# Patient Record
Sex: Male | Born: 1975
Health system: Southern US, Community
[De-identification: ages and names within clinical notes are randomized; demographics above are authoritative.]

## PROBLEM LIST (undated history)

## (undated) DIAGNOSIS — L409 Psoriasis, unspecified: Secondary | ICD-10-CM

## (undated) DIAGNOSIS — R002 Palpitations: Secondary | ICD-10-CM

## (undated) HISTORY — DX: Palpitations: R00.2

---

## 2010-02-01 ENCOUNTER — Emergency Department (HOSPITAL_COMMUNITY): Admission: EM | Admit: 2010-02-01 | Discharge: 2010-02-02 | Payer: Self-pay | Admitting: Emergency Medicine

## 2010-08-23 LAB — URINALYSIS, ROUTINE W REFLEX MICROSCOPIC
Bilirubin Urine: NEGATIVE
Glucose, UA: NEGATIVE mg/dL
Hgb urine dipstick: NEGATIVE
Ketones, ur: >80 mg/dL — AB
Leukocytes, UA: NEGATIVE
Protein, ur: 30 mg/dL — AB
Specific Gravity, Urine: 1.03 (ref 1.005–1.030)

## 2010-08-23 LAB — DIFFERENTIAL
Basophils Absolute: 0 10*3/uL (ref 0.0–0.1)
Basophils Relative: 0 % (ref 0–1)
Eosinophils Absolute: 0 10*3/uL (ref 0.0–0.7)
Eosinophils Relative: 0 % (ref 0–5)
Lymphs Abs: 1.4 10*3/uL (ref 0.7–4.0)
Neutro Abs: 8.5 10*3/uL — ABNORMAL HIGH (ref 1.7–7.7)
Neutrophils Relative %: 83 % — ABNORMAL HIGH (ref 43–77)

## 2010-08-23 LAB — COMPREHENSIVE METABOLIC PANEL
AST: 16 U/L (ref 0–37)
Albumin: 4.6 g/dL (ref 3.5–5.2)
Calcium: 9.4 mg/dL (ref 8.4–10.5)
GFR calc Af Amer: >60 mL/min (ref >60)
GFR calc non Af Amer: >60 mL/min (ref >60)
Glucose, Bld: 110 mg/dL — ABNORMAL HIGH (ref 70–99)
Potassium: 3.6 mEq/L (ref 3.5–5.1)

## 2010-08-23 LAB — URINE MICROSCOPIC-ADD ON

## 2010-08-23 LAB — CBC
HCT: 47.7 % (ref 39.0–52.0)
MCH: 31.1 pg (ref 26.0–34.0)
MCHC: 35.4 g/dL (ref 30.0–36.0)
MCV: 87.8 fL (ref 78.0–100.0)
RDW: 13.7 % (ref 11.5–15.5)

## 2010-08-23 LAB — URINE CULTURE
Colony Count: NO GROWTH
Culture: NO GROWTH

## 2017-08-07 ENCOUNTER — Encounter (HOSPITAL_COMMUNITY): Payer: Self-pay | Admitting: Emergency Medicine

## 2017-08-07 ENCOUNTER — Other Ambulatory Visit: Payer: Self-pay

## 2017-08-07 ENCOUNTER — Ambulatory Visit (HOSPITAL_COMMUNITY)
Admission: EM | Admit: 2017-08-07 | Discharge: 2017-08-07 | Disposition: A | Discharge status: Home / Self Care | Payer: 59 | Attending: Internal Medicine | Admitting: Internal Medicine

## 2017-08-07 DIAGNOSIS — M79605 Pain in left leg: Secondary | ICD-10-CM

## 2017-08-07 MED ORDER — MELOXICAM 7.5 MG PO TABS: 7.5000 mg | ORAL_TABLET | Freq: Every day | ORAL | 0 refills | Status: DC

## 2017-08-07 NOTE — Discharge Instructions (Signed)
Start Mobic as directed for pain/inflammation.  Ice compress, elevation, knee sleeve during activity.  Monitor for any worsening of symptoms, one-sided leg swelling, redness, increased warmth, chest pain, shortness of breath, go to the emergency department for further evaluation.  Otherwise follow-up with PCP for further evaluation and management needed.

## 2017-08-07 NOTE — ED Triage Notes (Signed)
Pt states he was working in the hull of his boat over a week ago, with his legs folded under him for almost an hour.  When he got up he had pain in his left ankle and knee along with a feeling of the leg being asleep that lasted for over an hour.  Pt states he woke up the next day feeling a little better but still had numbness in the toes.  He now states he has pain in his left upper posterior calf and he states he has a lump that that he feels is a bulging vein.

## 2017-08-07 NOTE — ED Provider Notes (Signed)
[facility]    CSN: [id number] Arrival date & time: 08/07/17  1010     History   Chief Complaint Chief Complaint  Patient presents with  . Leg Pain    left    HPI Daniel Ferguson is a 42 y.o. male.   42 year old male comes in for left leg pain. States that he was working on his boat 1 week ago, where he had his left leg folded under him for over an hour. When he got up, had numbness/tingling of the left lower extremity.  States that episode lasted for about an hour.  Patient also stated while he was sitting, did feel some pain of the lateral side of the left knee, and wonders if he strained something.  States he woke up the next morning, with improving symptoms, but had continued numbness and tingling of the toes.  He states that he feels a slight bump on the posterior side of his knee/calf.  Denies tenderness to palpation, erythema, increased warmth.  Denies swelling of the legs.  Denies chest pain, shortness of breath.  Has been taking aspirin without relief.      History reviewed. No pertinent past medical history.  There are no active problems to display for this patient.   History reviewed. No pertinent surgical history.     Home Medications    Prior to Admission medications   Medication Sig Start Date End Date Taking? Authorizing Provider  meloxicam (MOBIC) 7.5 MG tablet Take 1 tablet (7.5 mg total) by mouth daily. 08/07/17   Daniel Ferguson, Daniel Ofarrell V, PA-C    Family History No family history on file.  Social History Social History   Tobacco Use  . Smoking status: Current Every Day Smoker    Packs/day: 1.00    Types: Cigarettes  . Smokeless tobacco: Never Used  Substance Use Topics  . Alcohol use: Yes    Comment: rarely  . Drug use: Yes    Types: Marijuana     Allergies   Patient has no known allergies.   Review of Systems Review of Systems  Reason unable to perform ROS: See HPI as above.     Physical Exam Triage Vital Signs ED Triage Vitals    Enc Vitals Group     BP 08/07/17 1103 120/72     Pulse Rate 08/07/17 1103 79     Resp --      Temp 08/07/17 1103 98.4 F (36.9 C)     Temp Source 08/07/17 1103 Oral     SpO2 08/07/17 1103 100 %     Weight --      Height --      Head Circumference --      Peak Flow --      Pain Score 08/07/17 1100 0     Pain Loc --      Pain Edu? --      Excl. in GC? --    No data found.  Updated Vital Signs BP 120/72 (BP Location: Left Arm)   Pulse 79   Temp 98.4 F (36.9 C) (Oral)   SpO2 100%   Physical Exam  Constitutional: He is oriented to person, place, and time. He appears well-developed and well-nourished. No distress.  HENT:  Head: Normocephalic and atraumatic.  Eyes: Conjunctivae are normal. Pupils are equal, round, and reactive to light.  Musculoskeletal:  No swelling, erythema, increased warmth. 0.5 cm bulging of vein on the posterior knee consistent with varicose vein.  No  tenderness to palpation, erythema, increased warmth.  No tenderness to palpation of the knee.  Full range of motion of knee, ankle, toes.  Sensation intact and equal bilaterally.  Strength normal and equal bilaterally.  Pedal pulses 2+ and equal bilaterally.  Cap refill less than 2 seconds.  No pitting edema.  Neurological: He is alert and oriented to person, place, and time.     UC Treatments / Results  Labs (all labs ordered are listed, but only abnormal results are displayed) Labs Reviewed - No data to display  EKG  EKG Interpretation None       Radiology No results found.  Procedures Procedures (including critical care time)  Medications Ordered in UC Medications - No data to display   Initial Impression / Assessment and Plan / UC Course  I have reviewed the triage vital signs and the nursing notes.  Pertinent labs & imaging results that were available during my care of the patient were reviewed by me and considered in my medical decision making (see chart for details).    Left  lower extremity without swelling, erythema, increased warmth, low suspicion for DVT.  Slight bulging of the vein consistent with varicose vein.  Start Mobic for pain and inflammation.  Ice compress, elevation, knee sleeve during activity.  Patient to follow-up with PCP for further evaluation treatment needed.  Return precautions given.  Patient expresses understanding and agrees to plan.  Final Clinical Impressions(s) / UC Diagnoses   Final diagnoses:  Left leg pain    ED Discharge Orders        Ordered    meloxicam (MOBIC) 7.5 MG tablet  Daily     08/07/17 1215        Daniel Fisher, PA-C 08/07/17 1222

## 2017-09-07 ENCOUNTER — Other Ambulatory Visit: Payer: Self-pay

## 2017-09-07 ENCOUNTER — Encounter: Payer: Self-pay | Admitting: Family Medicine

## 2017-09-07 ENCOUNTER — Ambulatory Visit (INDEPENDENT_AMBULATORY_CARE_PROVIDER_SITE_OTHER): Payer: 59 | Admitting: Family Medicine

## 2017-09-07 VITALS — BP 110/70 | HR 76 | Temp 98.7°F | Ht 66.5 in | Wt 130.0 lb

## 2017-09-07 DIAGNOSIS — Z23 Encounter for immunization: Secondary | ICD-10-CM

## 2017-09-07 DIAGNOSIS — Z7689 Persons encountering health services in other specified circumstances: Secondary | ICD-10-CM | POA: Insufficient documentation

## 2017-09-07 DIAGNOSIS — F1290 Cannabis use, unspecified, uncomplicated: Secondary | ICD-10-CM | POA: Diagnosis not present

## 2017-09-07 DIAGNOSIS — H9313 Tinnitus, bilateral: Secondary | ICD-10-CM | POA: Diagnosis not present

## 2017-09-07 DIAGNOSIS — K1370 Unspecified lesions of oral mucosa: Secondary | ICD-10-CM | POA: Insufficient documentation

## 2017-09-07 DIAGNOSIS — F17200 Nicotine dependence, unspecified, uncomplicated: Secondary | ICD-10-CM

## 2017-09-07 HISTORY — DX: Unspecified lesions of oral mucosa: K13.70

## 2017-09-07 NOTE — Assessment & Plan Note (Signed)
Red discoloration on R lower lip in patient with significant sun exposure and smoking hx.  Patient does not know specific day it appeared but it has been present for ~2-62109yrs.  No changes in that time, not palpable, no purulence/leaking/pain  Referal to ENT for this and the tinnitis

## 2017-09-07 NOTE — Progress Notes (Signed)
Subjective:  Daniel Ferguson is a 42 y.o. male who [facility] with a chief complaint of establishing care.   Patient would like to establish care.  He is not taking any current medications and has no significant surgical/medical hx.   He does say he still smokes and is trying to quit and has reduced his intake.   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  He declines a nicotine patch today.   He does smoke marijuana occasionally and it does not interfere with his normal responsibilities.He mentions ringing in both ears, this has been going on for years now and he attributes it to loud construction jobs.  He says he can still hear otherwise but it's annoying and he would like to see a ENT to discuss this.   He also mentions a lesion on his right lower lip, it's a ~871mm red discoloration that is not palpable/painful.  It is not leaking/draining and has not changed size in the last 2-3 years sicne he noticed it.  A dentist told him to get it "checked out"  Review of Systems  Constitutional: Negative for chills and fever.  HENT: Positive for tinnitus. Negative for ear pain, hearing loss, sinus pain and sore throat.   Eyes: Negative for blurred vision, double vision and pain.  Respiratory: Negative for cough and shortness of breath.   Cardiovascular: Negative for chest pain, palpitations, orthopnea and leg swelling.  Gastrointestinal: Negative for abdominal pain and blood in stool.  Genitourinary: Negative for dysuria.  Musculoskeletal: Positive for back pain. Negative for joint pain.  Skin: Negative for rash.  Neurological: Negative for loss of consciousness.  Psychiatric/Behavioral: Positive for substance abuse.     Objective:  Physical Exam: BP 110/70   Pulse 76   Temp 98.7 F (37.1 C) (Oral)   Ht 5' 6.5" (1.689 m)   Wt 130 lb (59 kg)   SpO2 97%   BMI 20.67 kg/m   Gen: NAD, resting comfortably, appears fit CV: RRR with  no murmurs appreciated Pulm: NWOB, CTAB with no crackles, wheezes, or rhonchi GI: Normal bowel sounds present. Soft, Nontender, Nondistended. MSK: no edema, cyanosis, or clubbing noted Skin: warm, dry Neuro: grossly normal, moves all extremities Psych: Normal affect and thought content  No results found for this or any previous visit (from the past 72 hour(s)).   Assessment/Plan:  Oral lesion Red discoloration on R lower lip in patient with significant sun exposure and smoking hx.  Patient does not know specific day it appeared but it has been present for ~2-3331yrs.  No changes in that time, not palpable, no purulence/leaking/pain  Referal to ENT for this and the tinnitis  Tinnitus of both ears Tinnitis in both ears, chronic for years.  Still claims good hearing otherwise.   Has been in [employer] and likely has chronic damage from inadequate hearing protection.  ENT referral for this an concern of oral lesion, advised to be diligent about hearing protection  Tobacco use disorder ~20 pack year hx.  No medical workup for this, has been trying to cut down from 1 pack to half pack.  Encouraged cessation, offered nicotine patch but patient declined.  We did order a screening CXR since he has not had regular medical care but he will call his insurance to verify cost first.  Marijuana use Still uses occasionally.  Not affecting daily activities  Encouraged cessation   Marthenia RollingScott Scharlene Catalina, DO FAMILY MEDICINE  RESIDENT - PGY1 09/07/2017 5:34 PM

## 2017-09-07 NOTE — Assessment & Plan Note (Addendum)
~  20 pack year hx.  No medical workup for this, has been trying to cut down from 1 pack to half pack.  Encouraged cessation, offered nicotine patch but patient declined.  We did order a screening CXR since he has not had regular medical care but he will call his insurance to verify cost first.

## 2017-09-07 NOTE — Patient Instructions (Signed)
It was a pleasure to see you today! Thank you for choosing [facility] for your primary care. Daniel Ferguson was seen for establishing care. Come back to the clinic if you have any new concernns, and go to the emergency room if you have life threatening symptoms.  Thanks for coming.  We have [facility] and sent in a referal to an ear/nose/throat doctor. We also offered some nicotine patches to help stop smoking which you said you don't need quite yet.  It was nice meeting you.   If we did any lab work today, and the results require attention, either me or my nurse will get in touch with you. If everything is normal, you will get a letter in mail and a message via . If you don't hear from us in two weeks, please give us a call. Otherwise, we look forward to seeing you again at your next visit. If you have any questions or concerns before then, please call the clinic at [phone].  Please bring all your medications to every doctors visit  Sign up for My Chart to have easy access to your labs results, and communication with your Primary care physician.    Please check-out at the front desk before leaving the clinic.    Best,  Dr. Marthenia RollingScott Finnis Ferguson FAMILY MEDICINE RESIDENT - PGY1 09/07/2017 9:07 AM

## 2017-09-07 NOTE — Assessment & Plan Note (Signed)
Still uses occasionally.  Not affecting daily activities  Encouraged cessation

## 2017-09-07 NOTE — Assessment & Plan Note (Signed)
Tinnitis in both ears, chronic for years.  Still claims good hearing otherwise.   Has been in [employer] and likely has chronic damage from inadequate hearing protection.  ENT referral for this an concern of oral lesion, advised to be diligent about hearing protection

## 2017-09-08 ENCOUNTER — Ambulatory Visit
Admission: RE | Admit: 2017-09-08 | Discharge: 2017-09-08 | Disposition: A | Discharge status: Home / Self Care | Payer: 59 | Source: Ambulatory Visit | Attending: Family Medicine | Admitting: Family Medicine

## 2017-09-08 DIAGNOSIS — F17200 Nicotine dependence, unspecified, uncomplicated: Secondary | ICD-10-CM

## 2018-09-03 IMAGING — CR DG CHEST 2V
2 series · 2 of 2 positions shown · non-contrast
Comparison: None in PACs

CLINICAL DATA: Left chest discomfort.  Current smoker.

EXAM:
CHEST - 2 VIEW

[w chest pa]
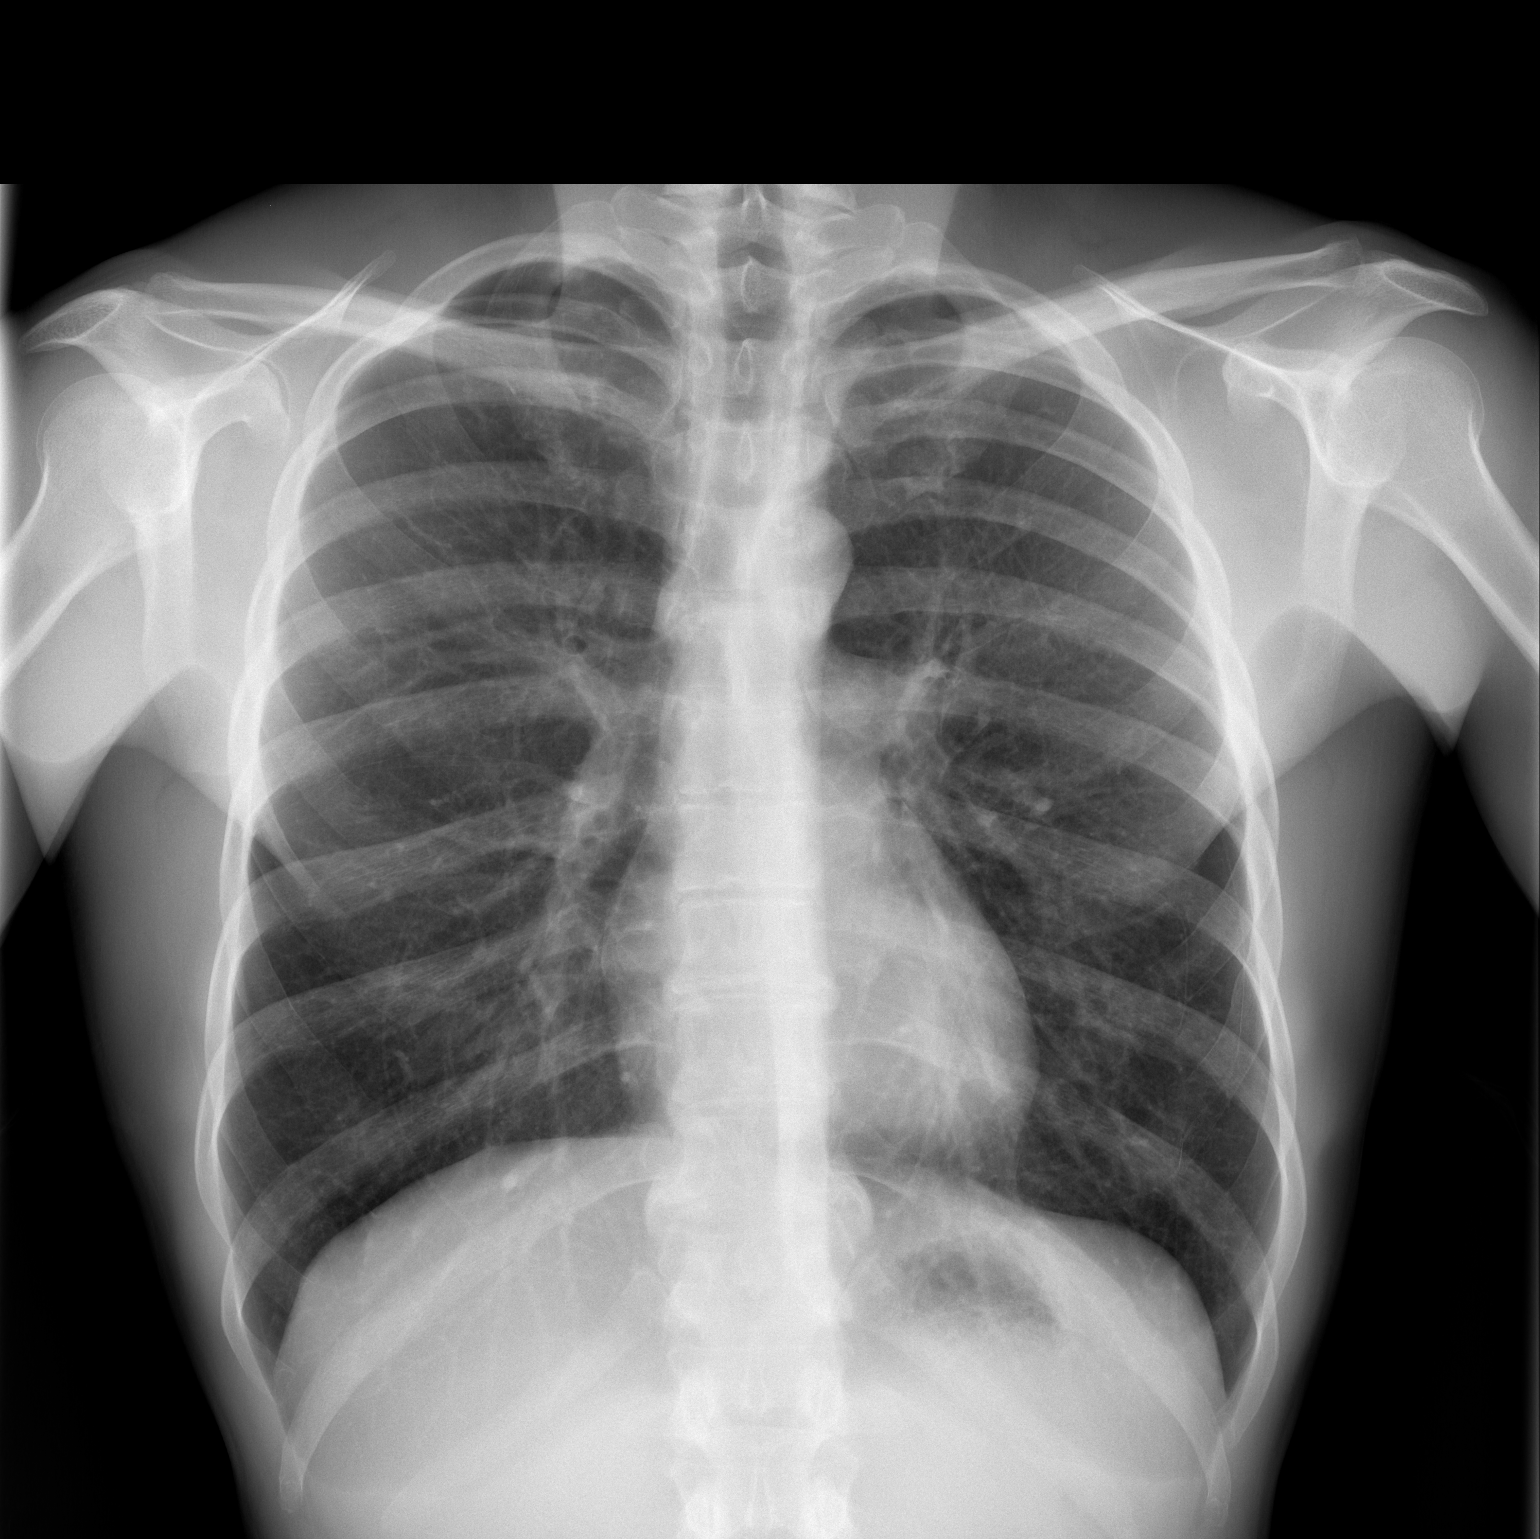

[w chest lat]
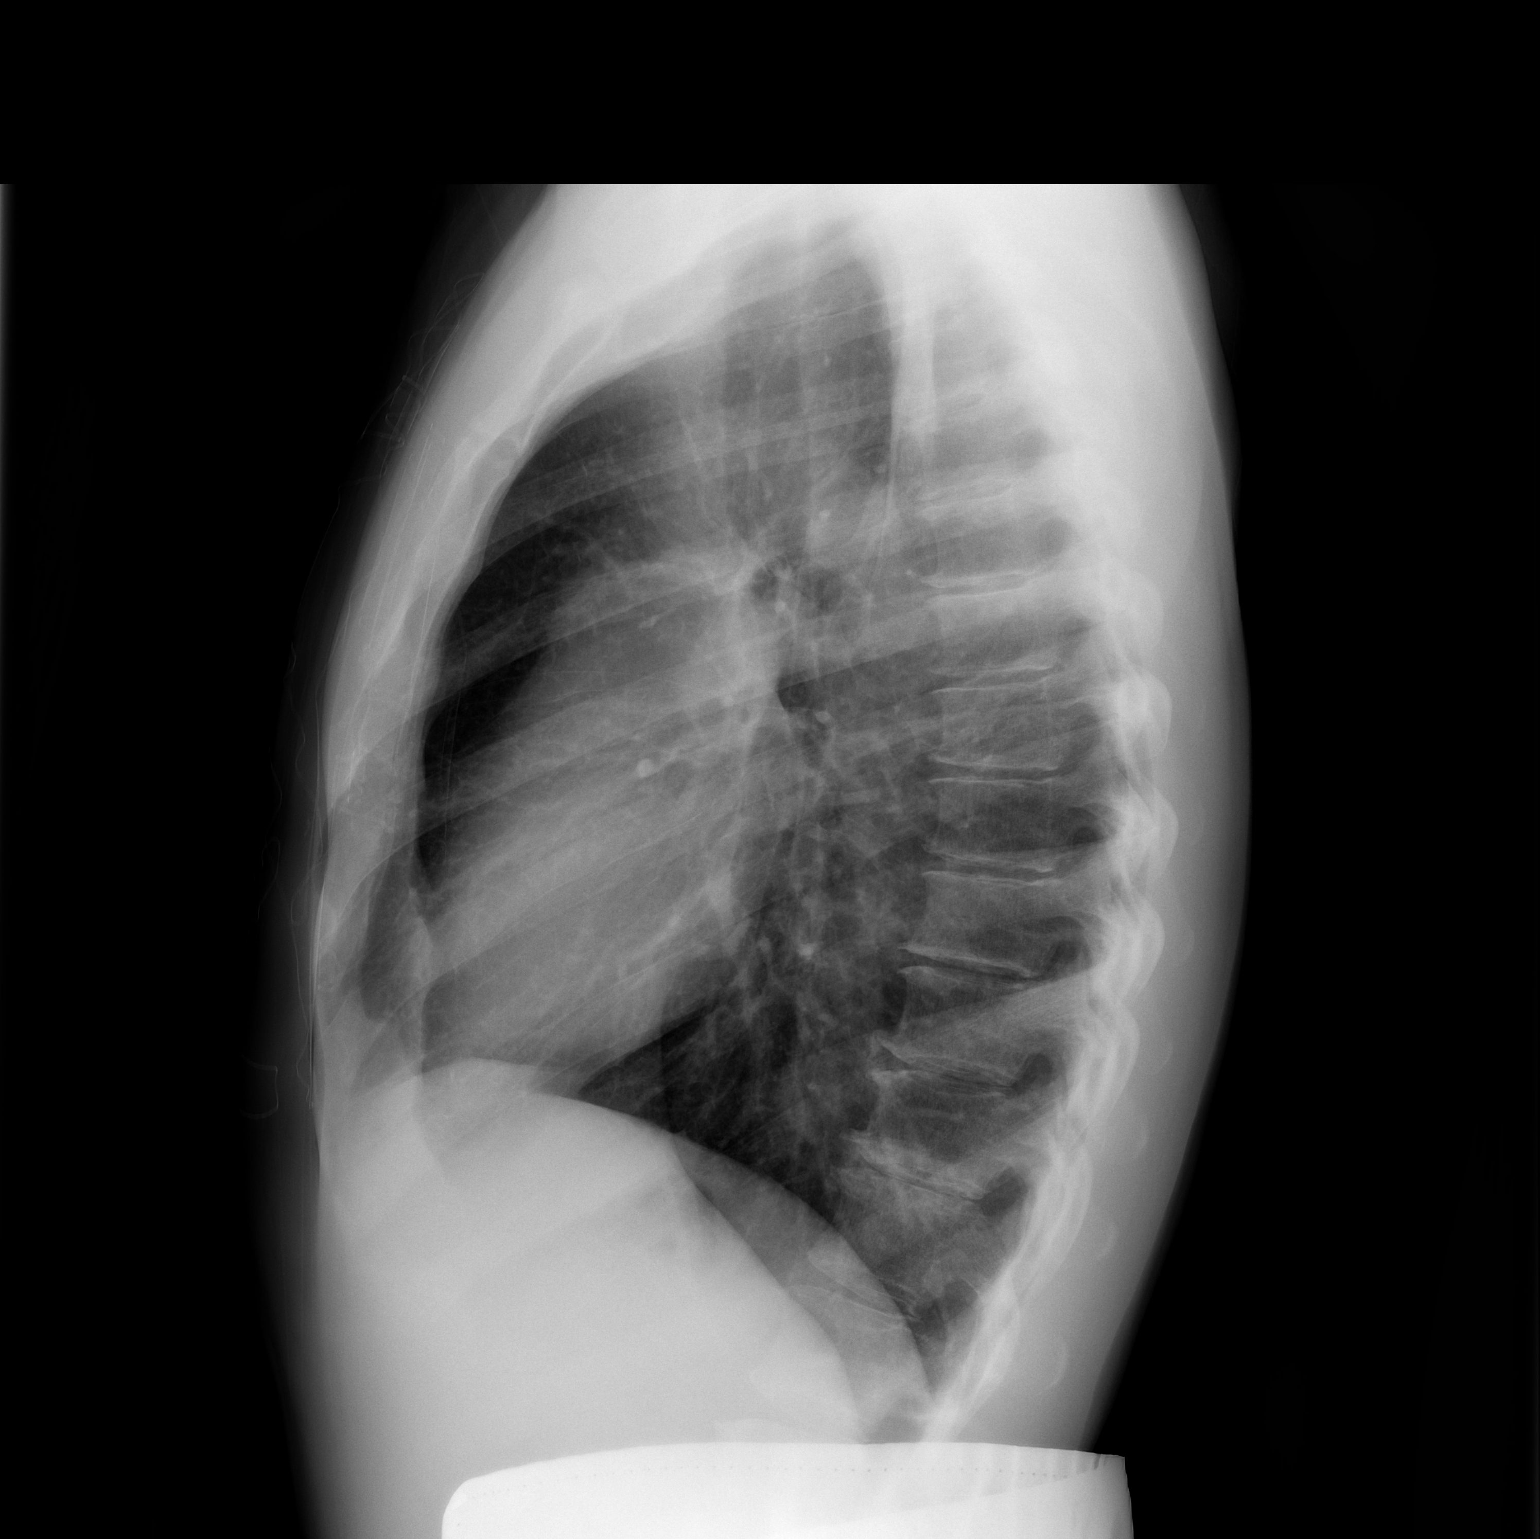

[2 of 2 positions shown; findings below may reference images not displayed]

FINDINGS: The lungs are mildly hyperinflated. There is no focal infiltrate.
There is no pleural effusion. The heart and pulmonary vascularity
are normal. The mediastinum is normal in width. There is
degenerative disc disease in the lower thoracic spine with large
lateral endplate osteophytes at T11-T12.
IMPRESSION: Mild hyperinflation consistent with reactive airway disease or acute
bronchitis. No alveolar pneumonia nor CHF.

Age advanced degenerative disc disease in the lower thoracic spine.

## 2020-08-06 ENCOUNTER — Other Ambulatory Visit: Payer: Self-pay

## 2020-08-06 ENCOUNTER — Ambulatory Visit (INDEPENDENT_AMBULATORY_CARE_PROVIDER_SITE_OTHER): Payer: 59 | Admitting: Family Medicine

## 2020-08-06 VITALS — BP 120/80 | HR 82 | Wt 139.6 lb

## 2020-08-06 DIAGNOSIS — B029 Zoster without complications: Secondary | ICD-10-CM | POA: Diagnosis not present

## 2020-08-06 NOTE — Patient Instructions (Signed)
It was wonderful to see you today.  We will continue treating this as a shingles.  We could also consider this being an unusual manifestation of an alternative rash, however please continue valacyclovir.  You are scheduled to see your ophthalmologist at [facility] on [address] at 2:10 PM please do not miss this appointment.

## 2020-08-06 NOTE — Assessment & Plan Note (Addendum)
Rash appearance without crossing midline appears consistent with herpes zoster, although does exhibit atypical symptoms.  Could additionally consider contact dermatitis, however given distribution above feel that this is less likely.  Does not appear consistent with known history of psoriasis.  Continue valacyclovir TID as prescribed through UC yesterday.  Called his ophthalmologist during appointment, scheduled for formal evaluation today at 2:10 PM at [facility] to rule out ophthalmologic involvement given rash location and hyperemia.

## 2020-08-06 NOTE — Progress Notes (Signed)
    SUBJECTIVE:   CHIEF COMPLAINT / HPI: Scalp/eye rash  Daniel Ferguson is a 45 year old gentleman presenting for evaluation of a rash.  He was seen at [facility] med walk-in clinic yesterday, 2/27, due to this rash and swelling of his right eyelid, diagnosed with presumed herpes zoster and given valacyclovir 1 gm TID x7 days.  He initially scheduled this appointment prior to this, but kept it for an additional evaluation as he had persistent eyelid swelling.  He reports the rash initially started on his right scalp around Tuesday and then by Thursday it was on his forehead/right eyebrow and side of his right face.  He reports it feels sensitive to the touch and occasionally tingling, "like hair moving in the wind".  Not exquisitely painful, however hurts a little bit.  He denies any known prodrome prior to rash onset.  He has not anything like this before.  No new shampoos/soaps/detergents.  Works in [employer], wears the same hard hat every day.  No one else at home or at work has a similar rash.  No exposure to poison ivy etc.  He reports a history of psoriasis and does have 2 small lesions on the other side of his scalp that have been present prior to this starting.  He denies any rash elsewhere, fatigue, fever, visual changes, eye pain, or hearing loss.  Does report a lymph node behind his right ear that has decreased in size.  PERTINENT  PMH / PSH: Tobacco and THC use  OBJECTIVE:   BP 120/80   Pulse 82   Wt 139 lb 9.6 oz (63.3 kg)   SpO2 97%   BMI 22.19 kg/m   General: Alert, NAD HEENT: NCAT, MMM, EOMI without pain, PERRLA.  Conjunctiva are clear bilaterally with the exception of mild hyperemia of the outer portion of his right eye.  Small <1cm posterior auricular mobile soft lymph node palpated behind right ear without any overlying erythema or tender to palpation.  Right upper eyelid swelling with mild erythema compared to left. Lungs: No increased WOB  Ext: Warm, dry, 2+ distal  pulses Derm: Rash present on the right side of the scalp, forehead, and right eyelid and does not cross midline.  Erythematous scattered papules with few in clusters.  Crusted over clusters/vesicles present on left face.  Pictured below.  2 small <1 cm circular scabbed and dry lesions present on left side of scalp.          ASSESSMENT/PLAN:   Herpes zoster Rash appearance without crossing midline appears consistent with herpes zoster, although does exhibit atypical symptoms.  Could additionally consider contact dermatitis, however given distribution above feel that this is less likely.  Does not appear consistent with known history of psoriasis.  Continue valacyclovir TID as prescribed through UC yesterday.  Called his ophthalmologist during appointment, scheduled for formal evaluation today at 2:10 PM at [facility] to rule out ophthalmologic involvement given rash location and hyperemia.    Recommended follow-up later this week, or sooner if associated fever, fatigue, rash spreading, or any vision changes.  Recommend proceeding with shingles vaccine after acute flare.  Allayne Stack, DO [facility]

## 2020-08-10 ENCOUNTER — Other Ambulatory Visit: Payer: Self-pay

## 2020-08-10 ENCOUNTER — Ambulatory Visit (INDEPENDENT_AMBULATORY_CARE_PROVIDER_SITE_OTHER): Payer: 59 | Admitting: Family Medicine

## 2020-08-10 VITALS — BP 114/60 | HR 67 | Wt 140.6 lb

## 2020-08-10 DIAGNOSIS — Z1159 Encounter for screening for other viral diseases: Secondary | ICD-10-CM

## 2020-08-10 DIAGNOSIS — B029 Zoster without complications: Secondary | ICD-10-CM | POA: Diagnosis not present

## 2020-08-10 DIAGNOSIS — F17200 Nicotine dependence, unspecified, uncomplicated: Secondary | ICD-10-CM | POA: Diagnosis not present

## 2020-08-10 DIAGNOSIS — Z114 Encounter for screening for human immunodeficiency virus [HIV]: Secondary | ICD-10-CM

## 2020-08-10 NOTE — Progress Notes (Signed)
    SUBJECTIVE:   CHIEF COMPLAINT / HPI: Shingles follow-up  Daniel Ferguson is a 45 year old gentleman presenting for a shingles follow-up.  He was seen by myself on 2/28 due to a right-sided facial/scalp rash concerning for herpes zoster.  Fortunately had already started valacyclovir through urgent care.  Followed up with ophthalmology later that day we did not see any evidence of shingles within his eye, however provided an eye cream (he can remember the name) to help prevent infection.  Today, he reports his rash is much better.  He still was having some right eyelid swelling throughout this week, however had no swelling this morning.  No further itching or irritation since earlier this week.  Will complete his valacyclovir course on Tuesday/Wednesday of next week.  PERTINENT  PMH / PSH: Tobacco use x 20 years--no cough or shortness of breath associated with this, psoriasis not on Bi[employer]  OBJECTIVE:   BP 114/60   Pulse 67   Wt 140 lb 9.6 oz (63.8 kg)   SpO2 99%   BMI 22.35 kg/m   General: Alert, NAD HEENT: NCAT, MMM, EOMI, PERRLA.  Conjunctiva clear bilaterally. Lungs: No increased WOB  Msk: Moves all extremities spontaneously  Ext: Warm, dry, 2+ distal pulses Derm: See pictures from today below and media tab for initial presentation on 07/2026.  Significantly improved rash still present on the right side of scalp, forehead, and right eyelid without crossing midline.  Mildly erythematous scattered papules that are nontender to palpation.  Improved appearance of crusted over clusters present on right face.       ASSESSMENT/PLAN:   Herpes zoster Significantly improved since 2/28 with valacyclovir therapy. No evidence of herpes zoster ophthalmicus per ophthalmology evaluation on 2/28.  Will obtain HIV screening to rule out contributing abnormality. Complete course of valacyclovir and continue eye cream as needed.  Encounter for hepatitis C screening test for low risk patient Hep  C antibody obtained.  Tobacco use disorder 20-pack-year history approximately.  Encouraged tobacco cessation.  Will check lipid panel.    Follow-up if rash recurs or if any associated visual abnormalities, neurological changes.  Allayne Stack, DO [facility]

## 2020-08-10 NOTE — Assessment & Plan Note (Signed)
Hep C antibody obtained.

## 2020-08-10 NOTE — Assessment & Plan Note (Signed)
20-pack-year history approximately.  Encouraged tobacco cessation.  Will check lipid panel.

## 2020-08-10 NOTE — Patient Instructions (Signed)
It was wonderful to see you today.  I am so glad to see that your rash is getting much better.  We are going to check HIV and hepatitis C to make sure that these are not underlying.  We will also go ahead and check your cholesterol.  Please make sure if the rash gets worse again, any visual changes, any weakness/numbness/slurred speech that you follow-up/go to the ED.

## 2020-08-10 NOTE — Assessment & Plan Note (Addendum)
Significantly improved since 2/28 with valacyclovir therapy. No evidence of herpes zoster ophthalmicus per ophthalmology evaluation on 2/28.  Will obtain HIV screening to rule out contributing abnormality. Complete course of valacyclovir and continue eye cream as needed.

## 2020-08-11 LAB — HEPATITIS C ANTIBODY: Hep C Virus Ab: <0.1 s/co ratio (ref 0.0–0.9)

## 2020-08-11 LAB — LIPID PANEL
Chol/HDL Ratio: 5 ratio (ref 0.0–5.0)
Cholesterol, Total: 174 mg/dL (ref 100–199)
HDL: 35 mg/dL — ABNORMAL LOW (ref >39)
LDL Chol Calc (NIH): 115 mg/dL — ABNORMAL HIGH (ref 0–99)
Triglycerides: 134 mg/dL (ref 0–149)
VLDL Cholesterol Cal: 24 mg/dL (ref 5–40)

## 2020-08-11 LAB — HIV ANTIBODY (ROUTINE TESTING W REFLEX): HIV Screen 4th Generation wRfx: NONREACTIVE

## 2020-08-13 ENCOUNTER — Encounter: Payer: Self-pay | Admitting: Family Medicine

## 2020-08-16 ENCOUNTER — Telehealth: Payer: Self-pay

## 2020-08-16 NOTE — Telephone Encounter (Signed)
Patient calls nurse line reporting he has finished the 10 day course of Valtrex for shingles. Patient reports he feels he is continuing to get better, however still has come tingling painful spots. Patient is wanting to know if he should continue on Valtrex and if so he needs a refill. Will forward to provider who saw patient.

## 2020-08-17 NOTE — Telephone Encounter (Signed)
He does not need a refill after he has completed the treatment.  Hopefully this sensation will continue to improve.  If it is still present the next few weeks, please have him follow-up.  Allayne Stack, DO

## 2020-08-20 NOTE — Telephone Encounter (Signed)
Called patient and informed him of message from Dr. Annia Friendly.  Patient verbalized understanding.  Glennie Hawk, CMA

## 2022-06-29 ENCOUNTER — Emergency Department (HOSPITAL_COMMUNITY): Payer: 59

## 2022-06-29 ENCOUNTER — Emergency Department (HOSPITAL_COMMUNITY)
Admission: EM | Admit: 2022-06-29 | Discharge: 2022-06-29 | Disposition: A | Discharge status: Home / Self Care | Payer: 59 | Attending: Emergency Medicine | Admitting: Emergency Medicine

## 2022-06-29 ENCOUNTER — Encounter (HOSPITAL_COMMUNITY): Payer: Self-pay | Admitting: *Deleted

## 2022-06-29 ENCOUNTER — Encounter (HOSPITAL_COMMUNITY): Payer: Self-pay | Admitting: Emergency Medicine

## 2022-06-29 ENCOUNTER — Ambulatory Visit (HOSPITAL_COMMUNITY)
Admission: EM | Admit: 2022-06-29 | Discharge: 2022-06-29 | Payer: 59 | Attending: Emergency Medicine | Admitting: Emergency Medicine

## 2022-06-29 ENCOUNTER — Other Ambulatory Visit: Payer: Self-pay

## 2022-06-29 DIAGNOSIS — R002 Palpitations: Secondary | ICD-10-CM | POA: Insufficient documentation

## 2022-06-29 DIAGNOSIS — R9431 Abnormal electrocardiogram [ECG] [EKG]: Secondary | ICD-10-CM

## 2022-06-29 DIAGNOSIS — L409 Psoriasis, unspecified: Secondary | ICD-10-CM | POA: Diagnosis not present

## 2022-06-29 DIAGNOSIS — F17200 Nicotine dependence, unspecified, uncomplicated: Secondary | ICD-10-CM | POA: Insufficient documentation

## 2022-06-29 HISTORY — DX: Psoriasis, unspecified: L40.9

## 2022-06-29 LAB — BASIC METABOLIC PANEL
Anion gap: 12 (ref 5–15)
BUN: 10 mg/dL (ref 6–20)
CO2: 23 mmol/L (ref 22–32)
Calcium: 9.1 mg/dL (ref 8.9–10.3)
Chloride: 103 mmol/L (ref 98–111)
Creatinine, Ser: 0.87 mg/dL (ref 0.61–1.24)
GFR, Estimated: >60 mL/min (ref >60)
Glucose, Bld: 98 mg/dL (ref 70–99)
Potassium: 3.7 mmol/L (ref 3.5–5.1)
Sodium: 138 mmol/L (ref 135–145)

## 2022-06-29 LAB — CBC WITH DIFFERENTIAL/PLATELET
Abs Immature Granulocytes: 0.01 10*3/uL (ref 0.00–0.07)
Basophils Absolute: 0 10*3/uL (ref 0.0–0.1)
Basophils Relative: 1 %
Eosinophils Absolute: 0.1 10*3/uL (ref 0.0–0.5)
Eosinophils Relative: 2 %
HCT: 48.1 % (ref 39.0–52.0)
Hemoglobin: 16.6 g/dL (ref 13.0–17.0)
Immature Granulocytes: 0 %
Lymphocytes Relative: 40 %
Lymphs Abs: 2.8 10*3/uL (ref 0.7–4.0)
MCH: 30.2 pg (ref 26.0–34.0)
MCHC: 34.5 g/dL (ref 30.0–36.0)
MCV: 87.5 fL (ref 80.0–100.0)
Monocytes Absolute: 0.5 10*3/uL (ref 0.1–1.0)
Monocytes Relative: 7 %
Neutro Abs: 3.5 10*3/uL (ref 1.7–7.7)
Neutrophils Relative %: 50 %
Platelets: 292 10*3/uL (ref 150–400)
RBC: 5.5 MIL/uL (ref 4.22–5.81)
RDW: 12.7 % (ref 11.5–15.5)
WBC: 6.9 10*3/uL (ref 4.0–10.5)
nRBC: 0 % (ref 0.0–0.2)

## 2022-06-29 LAB — TROPONIN I (HIGH SENSITIVITY)
Troponin I (High Sensitivity): 4 ng/L (ref <18)
Troponin I (High Sensitivity): 4 ng/L (ref <18)

## 2022-06-29 LAB — D-DIMER, QUANTITATIVE: D-Dimer, Quant: <0.27 ug/mL-FEU (ref 0.00–0.50)

## 2022-06-29 NOTE — Discharge Instructions (Signed)
You were seen in the emergency department for possible irregular heartbeat elevated blood pressure and otherwise not feeling well.  You had lab work EKG chest x-ray that did not show an obvious explanation for your symptoms.  We are putting in a referral for you to follow-up with cardiology.  Please also schedule an appointment with your primary care doctor.  Stay well-hydrated.  Monitor for worsening symptoms and return if any concerns.

## 2022-06-29 NOTE — ED Notes (Signed)
Patient verbalizes understanding of discharge instructions. Opportunity for questioning and answers were provided. Armband removed by staff, pt discharged from ED. Pt ambulatory to ED waiting room with steady gait.  

## 2022-06-29 NOTE — ED Triage Notes (Addendum)
Pt reports starting with episodes of "feeling weird" since yesterday; pt states he does not experience any chest pain, dizziness, lightheadedness, but "something just feels off" at times. Took BP at home yesterday and it noted "irregular" on the machine; pt states when he felt his pulse in his neck, it felt irregular at that time. States he felt like he had to crack the car window yesterday bc "I wasn't SOB, but I felt like I couldn't get a good breath". Pt took ASA yesterday and today and "that seems to help a little". Also reports having had night sweats during the night for a couple nights this past week.

## 2022-06-29 NOTE — ED Triage Notes (Signed)
Pt here with reports of possibly having an irregular heart rate over the last few days along with "feeling off" and having an impending sense of doom. States sometimes it feels like he can't take a deep breath.

## 2022-06-29 NOTE — ED Provider Triage Note (Signed)
Emergency Medicine Provider Triage Evaluation Note  Daniel Ferguson , a 46 y.o. male  was evaluated in triage.  Pt complains of palpitations, occasional shortness of breath, labile blood pressure and a feeling of impending doom. Patient was seen at [facility] and sent to the ED with concern for PE.Marland Kitchen  Review of Systems  Positive: Palpitations, shortness of breath Negative: Chest pain, abdominal pain  Physical Exam  BP (!) 145/115   Pulse 80   Temp 97.7 F (36.5 C) (Oral)   Resp 19   SpO2 98%  Gen:   Awake, no distress   Resp:  Normal effort  MSK:   Moves extremities without difficulty  Other:    Medical Decision Making  Medically screening exam initiated at 6:28 PM.  Appropriate orders placed.  Daniel Ferguson was informed that the remainder of the evaluation will be completed by another provider, this initial triage assessment does not replace that evaluation, and the importance of remaining in the ED until their evaluation is complete.     Etta Quill, NP 06/29/22 [phone]

## 2022-06-29 NOTE — ED Provider Notes (Signed)
[facility]    CSN: [id number] Arrival date & time: 06/29/22  1646    HISTORY   Chief Complaint  Patient presents with   Palpitations   HPI Daniel Ferguson is a pleasant, 47 y.o. male who presents to urgent care today. Patient complains of "feeling weird" since yesterday.  Patient states he is not experience chest pain, dizziness, lightheadedness but has intermittently felt short of breath without coughing.  Patient describes the shortness of breath sensation as feeling like he needs to take a deep breath.  Patient also states that he has noticed that he has had a sensation of feeling like he has gotten some really bad news or that something bad is about to happen, describes it as a momentary feeling which does not last long but seems to keep coming back.  Patient states has been taking aspirin intermittently which seems to give him a sense of relief however after the aspirin starts to wear off Feeling comes back again.  Patient states he has been checking his blood pressure at home which has been a little elevated and a couple of times while checking his blood pressure the machine has stated that his heart rhythm has been irregular.  Patient also endorses a few episodes of night sweats over the past few weeks.  Patient states he is a current every day smoker and also smokes marijuana.  Patient denies history of blood clots and heart disease.  The history is provided by the patient.   Past Medical History:  Diagnosis Date   Oral lesion 09/07/2017   Psoriasis    Patient Active Problem List   Diagnosis Date Noted   Encounter for hepatitis C screening test for low risk patient 08/10/2020   Herpes zoster 08/06/2020   Marijuana use 09/07/2017   Tobacco use disorder 09/07/2017   Encounter to establish care 09/07/2017   Tinnitus of both ears 09/07/2017   History reviewed. No pertinent surgical history.  Home Medications    Prior to Admission medications   Not on File     Family History History reviewed. No pertinent family history. Social History Social History   Tobacco Use   Smoking status: Every Day    Packs/day: 1.00    Types: Cigarettes   Smokeless tobacco: Never  Vaping Use   Vaping Use: Some days  Substance Use Topics   Alcohol use: Yes    Comment: rarely   Drug use: Yes    Types: Marijuana    Comment: "rarely"   Allergies   Patient has no known allergies.  Review of Systems Review of Systems Pertinent findings revealed after performing a 14 point review of systems has been noted in the history of present illness.  Triage Vital Signs ED Triage Vitals  Enc Vitals Group     BP 04/05/21 0827 (!) 147/82     Pulse Rate 04/05/21 0827 72     Resp 04/05/21 0827 18     Temp 04/05/21 0827 98.3 F (36.8 C)     Temp Source 04/05/21 0827 Oral     SpO2 04/05/21 0827 98 %     Weight --      Height --      Head Circumference --      Peak Flow --      Pain Score 04/05/21 0826 5     Pain Loc --      Pain Edu? --      Excl. in GC? --   No data  found.  Updated Vital Signs BP (!) 142/94   Pulse 82 Comment: regular upon palpation  Temp 98.1 F (36.7 C) (Oral)   Resp 16   SpO2 97%   Physical Exam Vitals and nursing note reviewed.  Constitutional:      General: He is not in acute distress.    Appearance: Normal appearance. He is normal weight. He is not ill-appearing.  HENT:     Head: Normocephalic and atraumatic.  Eyes:     Extraocular Movements: Extraocular movements intact.     Conjunctiva/sclera: Conjunctivae normal.     Pupils: Pupils are equal, round, and reactive to light.  Cardiovascular:     Rate and Rhythm: Normal rate and regular rhythm.  Pulmonary:     Effort: Pulmonary effort is normal. No tachypnea, bradypnea, accessory muscle usage, prolonged expiration, respiratory distress or retractions.     Breath sounds: No stridor, decreased air movement or transmitted upper airway sounds. Examination of the right-middle  field reveals decreased breath sounds. Examination of the left-middle field reveals decreased breath sounds. Examination of the right-lower field reveals decreased breath sounds. Examination of the left-lower field reveals decreased breath sounds. Decreased breath sounds present. No wheezing, rhonchi or rales.  Musculoskeletal:        General: Normal range of motion.     Cervical back: Normal range of motion and neck supple.  Skin:    General: Skin is warm and dry.  Neurological:     General: No focal deficit present.     Mental Status: He is alert and oriented to person, place, and time. Mental status is at baseline.  Psychiatric:        Mood and Affect: Mood normal.        Behavior: Behavior normal.        Thought Content: Thought content normal.        Judgment: Judgment normal.     UC Couse / Diagnostics / Procedures:     Radiology No results found.  Procedures ED EKG  Date/Time: 06/29/2022 5:48 PM  Performed by: Lynden Oxford Scales, PA-C Authorized by: Lynden Oxford Scales, PA-C   Previous ECG:    Previous ECG:  Unavailable Interpretation:    Interpretation: abnormal     Details:  Right axis deviation Rate:    ECG rate assessment: normal   Rhythm:    Rhythm: sinus bradycardia   Ectopy:    Ectopy: none   QRS:    QRS axis:  Normal   QRS intervals:  Normal   QRS conduction: normal   ST segments:    ST segments:  Normal T waves:    T waves: peaked     Peaked:  V2, V3 and V4 Q waves:    Abnormal Q-waves: present     Q waves:  II, III and aVF  (including critical care time) EKG  Pending results:  Labs Reviewed - No data to display  Medications Ordered in UC: Medications - No data to display  UC Diagnoses / Final Clinical Impressions(s)   I have reviewed the triage vital signs and the nursing notes.  Pertinent labs & imaging results that were available during my care of the patient were reviewed by me and considered in my medical decision making (see  chart for details).    Final diagnoses:  Right axis deviation   Patient advised to go to the emergency room for further evaluation of right axis deviation to rule out pulmonary embolism and history of smoking.  ED  Prescriptions   None    PDMP not reviewed this encounter.  Disposition Upon Discharge:  Condition: stable for discharge Emergency Room via Patient verbally agrees to go now, patient is accompanied by his wife today.    Patient presented with an acute illness with associated systemic symptoms and significant discomfort requiring urgent management. In my opinion, this is a condition that a prudent lay person (someone who possesses an average knowledge of health and medicine) may potentially expect to result in complications if not addressed urgently such as respiratory distress, impairment of bodily function or dysfunction of bodily organs.   As such, the patient has been evaluated and assessed, work-up was performed and treatment was provided in alignment with urgent care protocols and evidence based medicine.  The patient will follow up with their current PCP if and as advised. If the patient does not currently have a PCP we will assist them in obtaining one.   The patient may need specialty follow up if the symptoms continue, in spite of conservative treatment and management, for further workup, evaluation, consultation and treatment as clinically indicated and appropriate.  Patient/parent/caregiver verbalized understanding and agreement of plan as discussed.  All questions were addressed during visit.  Please see discharge instructions below for further details of plan.  This office note has been dictated using Museum/gallery curator.  Unfortunately, this method of dictation can sometimes lead to typographical or grammatical errors.  I apologize for your inconvenience in advance if this occurs.  Please do not hesitate to reach out to me if clarification is needed.       Lynden Oxford Scales, PA-C 06/29/22 1751

## 2022-06-29 NOTE — ED Provider Notes (Signed)
[facility] Provider Note   CSN: [id number] Arrival date & time: 06/29/22  1742     History  No chief complaint on file.   Daniel Ferguson is a 47 y.o. male.  He has no significant past medical history.  He said starting yesterday he has had this uneasy feeling in his body.  He denies any specific pain.  He checked his blood pressure found to be a little elevated but it was also reading irregular pulse.  He does not endorse any chest pain.  He does not routinely see a doctor.  He is a smoker and sometimes uses marijuana but denies any other drugs.  His review of systems is otherwise unremarkable.  He went to urgent care and they recommended he come here for further evaluation.  The history is provided by the patient.  Palpitations Palpitations quality:  Irregular Onset quality:  Gradual Duration:  2 days Timing:  Intermittent Progression:  Unchanged Chronicity:  New Relieved by:  None tried Worsened by:  Nothing Ineffective treatments:  None tried Associated symptoms: no chest pain, no chest pressure, no cough, no diaphoresis, no nausea, no near-syncope, no numbness, no shortness of breath, no syncope, no vomiting and no weakness   Risk factors: no diabetes mellitus, no heart disease, no hx of atrial fibrillation and no hx of PE        Home Medications Prior to Admission medications   Not on File      Allergies    Patient has no known allergies.    Review of Systems   Review of Systems  Constitutional:  Negative for diaphoresis and fever.  HENT:  Negative for sore throat.   Eyes:  Negative for visual disturbance.  Respiratory:  Negative for cough and shortness of breath.   Cardiovascular:  Positive for palpitations. Negative for chest pain, syncope and near-syncope.  Gastrointestinal:  Negative for abdominal pain, nausea and vomiting.  Genitourinary:  Negative for dysuria.  Musculoskeletal:  Negative for gait problem.   Neurological:  Negative for weakness and numbness.    Physical Exam Updated Vital Signs BP (!) 145/115   Pulse 80   Temp 97.7 F (36.5 C) (Oral)   Resp 19   SpO2 98%  Physical Exam Vitals and nursing note reviewed.  Constitutional:      General: He is not in acute distress.    Appearance: Normal appearance. He is well-developed.  HENT:     Head: Normocephalic and atraumatic.  Eyes:     Conjunctiva/sclera: Conjunctivae normal.  Cardiovascular:     Rate and Rhythm: Normal rate and regular rhythm.     Pulses: Normal pulses.     Heart sounds: No murmur heard. Pulmonary:     Effort: Pulmonary effort is normal. No respiratory distress.     Breath sounds: Normal breath sounds.  Abdominal:     Palpations: Abdomen is soft.     Tenderness: There is no abdominal tenderness. There is no guarding or rebound.  Musculoskeletal:     Cervical back: Neck supple.     Right lower leg: No edema.     Left lower leg: No edema.  Skin:    General: Skin is warm and dry.     Capillary Refill: Capillary refill takes less than 2 seconds.     Comments: He has some psoriasiform lesions on his extremities.  Neurological:     General: No focal deficit present.     Mental Status: He  is alert.     Gait: Gait normal.     ED Results / Procedures / Treatments   Labs (all labs ordered are listed, but only abnormal results are displayed) Labs Reviewed  CBC WITH DIFFERENTIAL/PLATELET  BASIC METABOLIC PANEL  D-DIMER, QUANTITATIVE  TROPONIN I (HIGH SENSITIVITY)  TROPONIN I (HIGH SENSITIVITY)    EKG EKG Interpretation  Date/Time:  Sunday June 29 2022 20:46:52 EST Ventricular Rate:  75 PR Interval:  171 QRS Duration: 102 QT Interval:  407 QTC Calculation: 455 R Axis:   97 Text Interpretation: Sinus rhythm Probable lateral infarct, old No significant change since last tracing today Confirmed by Aletta Edouard [phone]) on 06/29/2022 8:53:53 PM  Radiology DG Chest 2 View  Result Date:  06/29/2022 CLINICAL DATA:  Palpitations EXAM: CHEST - 2 VIEW COMPARISON:  09/08/2017 FINDINGS: Normal heart size, mediastinal contours, and pulmonary vascularity. Mildly hyperinflated lungs otherwise clear. No pulmonary infiltrate, pleural effusion, or pneumothorax. Osseous structures unremarkable. IMPRESSION: Chronic mild pulmonary hyperinflation. No acute abnormalities. Electronically Signed   By: Lavonia Dana M.D.   On: 06/29/2022 21:02    Procedures Procedures    Medications Ordered in ED Medications - No data to display  ED Course/ Medical Decision Making/ A&P Clinical Course as of 06/30/22 1026  Sun Jun 29, 2022  2106 Chest x-ray interpreted by me as no infiltrates.  Awaiting radiology reading. [MB]    Clinical Course User Index [MB] Hayden Rasmussen, MD                             Medical Decision Making Amount and/or Complexity of Data Reviewed Radiology: ordered.   This patient complains of palpitations, feeling unwell; this involves an extensive number of treatment Options and is a complaint that carries with it a high risk of complications and morbidity. The differential includes arrhythmia, anemia, ACS, pneumonia, viral syndrome, metabolic derangement  I ordered, reviewed and interpreted labs, which included CBC and chemistries unremarkable, troponin unremarkable and normal D-dimer I ordered imaging studies which included chest x-ray and I independently    visualized and interpreted imaging which showed no acute findings Additional history obtained from patient's wife Previous records obtained and reviewed urgent care visit Cardiac monitoring reviewed, normal sinus rhythm Social determinants considered, ongoing tobacco use Critical Interventions: None  After the interventions stated above, I reevaluated the patient and found patient to be awake alert no distress Admission and further testing considered, no indications for further workup at this time.  Counseled  patient to follow-up with PCP and continue to monitor symptoms.  Return instructions discussed         Final Clinical Impression(s) / ED Diagnoses Final diagnoses:  Palpitations    Rx / DC Orders ED Discharge Orders          Ordered    Ambulatory referral to Cardiology       Comments: If you have not heard from the Cardiology office within the next 72 hours please call [phone].   06/29/22 2149              Hayden Rasmussen, MD 06/30/22 1029

## 2022-07-02 ENCOUNTER — Ambulatory Visit (INDEPENDENT_AMBULATORY_CARE_PROVIDER_SITE_OTHER): Payer: 59 | Admitting: Student

## 2022-07-02 ENCOUNTER — Encounter: Payer: Self-pay | Admitting: Student

## 2022-07-02 VITALS — BP 120/82 | HR 64 | Ht 66.0 in | Wt 130.0 lb

## 2022-07-02 DIAGNOSIS — R002 Palpitations: Secondary | ICD-10-CM | POA: Diagnosis not present

## 2022-07-02 NOTE — Progress Notes (Signed)
    SUBJECTIVE:   CHIEF COMPLAINT / HPI:   Patient is a 47 year old male presenting today for hospital follow-up.  Was seen in the ED for palpitations, his EKG showed normal sinus rhythm and troponin was within normal limits. Referral for cardiologist placed in the ED and currently have appointment scheduled for 02/08.  Patient reports no other episodes symptom since discharge from ED. Patient reported symptom started Saturday morning when he had a funny feeling on his drive home. Check BP that was initially slightly high but improved on recheck. Funny feeling also happened  on Sunday promoting visit to urgent care and then advised to go to the ED. Patient unable to explain this feeling but suspect palpitation, irregular beat, denies sweating, chest pain or change in vision. Only recent change was he started vaping 3-4 months ago. Patient is a chronic smoker and report starting use of Vape 3-4 months ago. Other recent life changes include wife who was diagnosed with meningioma s/p surgical removal end of September 2023 and she is doing fine now.  Denies any issues with panic attack or anxiety in the past.  PERTINENT  PMH: Reviewed   OBJECTIVE:   BP 120/82   Pulse 64   Ht 5\' 6"  (1.676 m)   SpO2 100%   BMI 22.69 kg/m    Physical Exam General: Alert, well appearing, NAD Cardiovascular: RRR, No Murmurs, Normal S2/S2 Respiratory: CTAB, No wheezing or Rales Abdomen: No distension or tenderness Extremities: No edema on extremities    ASSESSMENT/PLAN:   Hospital follow-up Unclear cause of patient's episodes over the weekend.  However suspect transient arrhythmia, panic attack or anxiety disorder.  His workup has been normal and physical exam unremarkable.  Reassuring that he has not had any other episodes.  He could benefit from cardiology evaluation or ambulatory continuous cardiac given concerns of possible transient arrhythmias. - Ordered TSH to evaluate thyroid level -Encourage patient  to follow-up with his cardiology appointment -Reviewed return precautions with patient   Alen Bleacher, MD [place]

## 2022-07-02 NOTE — Patient Instructions (Addendum)
It was wonderful to meet you today. Thank you for allowing me to be a part of your care. Below is a short summary of what we discussed at your visit today:  I suspect you could possibly had panic attack or transient arrhythmia.  Today I ordered lab for your thyroid level.  Will follow-up with you with results.  Please make sure to follow-up with the cardiologist appointment you have scheduled.  Please bring all of your medications to every appointment!  If you have any questions or concerns, please do not hesitate to contact us via phone or MyChart message.   Alen Bleacher, MD [facility]

## 2022-07-03 LAB — TSH: TSH: 3.48 u[IU]/mL (ref 0.450–4.500)

## 2022-07-17 ENCOUNTER — Encounter: Payer: Self-pay | Admitting: Cardiology

## 2022-07-17 ENCOUNTER — Ambulatory Visit: Payer: 59 | Attending: Cardiology | Admitting: Cardiology

## 2022-07-17 VITALS — BP 127/78 | HR 86 | Ht 66.0 in | Wt 132.6 lb

## 2022-07-17 DIAGNOSIS — R002 Palpitations: Secondary | ICD-10-CM | POA: Diagnosis not present

## 2022-07-17 NOTE — Progress Notes (Signed)
Cardiology Office Note:    Date:  07/17/2022   ID:  Daniel Ferguson, DOB 05/02/1976, MRN [id number]  PCP:  Holley Bouche, [facility] Cardiologist:  None     Referring MD: Hayden Rasmussen, MD    History of Present Illness:    Daniel Ferguson is a 47 y.o. male here for the evaluation of palpitations at the request of Dr. Aletta Edouard.  He was seen previously in the emergency department on 06/29/2022 with palpitations.  Had episode on Sat 2 weeks ago. Went to urgent care Sunday. Went to ER. Troponin was drawn and was normal.  EKG showed normal sinus rhythm.  Overall otherwise been feeling well.  He started vaping about 4 months ago.  Chronic smoker before that.    Father had MI 2023, Dr. Burt Knack.   Past Medical History:  Diagnosis Date   Oral lesion 09/07/2017   Palpitations    Psoriasis     No past surgical history on file.  Current Medications: No outpatient medications have been marked as taking for the 07/17/22 encounter (Office Visit) with Jerline Pain, MD.     Allergies:   Patient has no known allergies.   Social History   Socioeconomic History   Marital status: Married    Spouse name: Not on file   Number of children: Not on file   Years of education: Not on file   Highest education level: Not on file  Occupational History   Not on file  Tobacco Use   Smoking status: Every Day    Packs/day: 1.00    Types: Cigarettes   Smokeless tobacco: Never  Vaping Use   Vaping Use: Some days  Substance and Sexual Activity   Alcohol use: Yes    Comment: rarely   Drug use: Yes    Types: Marijuana    Comment: "rarely"   Sexual activity: Not on file  Other Topics Concern   Not on file  Social History Narrative   Not on file   Social Determinants of Health   Financial Resource Strain: Not on file  Food Insecurity: Not on file  Transportation Needs: Not on file  Physical Activity: Not on file  Stress: Not on file  Social  Connections: Not on file     Family History: As above  ROS:   Please see the history of present illness.    No fevers chills nausea vomiting syncope bleeding all other systems reviewed and are negative.  EKGs/Labs/Other Studies Reviewed:    The following studies were reviewed today: ER notes lab work EKG  EKG: Sinus rhythm with no other abnormalities.  Recent Labs: 06/29/2022: BUN 10; Creatinine, Ser 0.87; Hemoglobin 16.6; Platelets 292; Potassium 3.7; Sodium 138 07/02/2022: TSH 3.480  Recent Lipid Panel    Component Value Date/Time   CHOL 174 08/10/2020 0914   TRIG 134 08/10/2020 0914   HDL 35 (L) 08/10/2020 0914   CHOLHDL 5.0 08/10/2020 0914   LDLCALC 115 (H) 08/10/2020 0914     Risk Assessment/Calculations:               Physical Exam:    VS:  BP 127/78   Pulse 86   Ht 5\' 6"  (1.676 m)   Wt 132 lb 9.6 oz (60.1 kg)   SpO2 96%   BMI 21.40 kg/m     Wt Readings from Last 3 Encounters:  07/17/22 132 lb 9.6 oz (60.1 kg)  07/02/22 130 lb (59 kg)  08/10/20 140 lb 9.6 oz (63.8 kg)     GEN:  Well nourished, well developed in no acute distress HEENT: Normal NECK: No JVD; No carotid bruits LYMPHATICS: No lymphadenopathy CARDIAC: RRR, no murmurs, rubs, gallops RESPIRATORY:  Clear to auscultation without rales, wheezing or rhonchi  ABDOMEN: Soft, non-tender, non-distended MUSCULOSKELETAL:  No edema; No deformity  SKIN: Warm and dry NEUROLOGIC:  Alert and oriented x 3 PSYCHIATRIC:  Normal affect   ASSESSMENT:    1. Palpitations    PLAN:    In order of problems listed above:  Palpitations -Transient.  No high risk symptoms such as syncope shortness of breath.  He feels great now.  Troponin was normal.  EKG unremarkable no ischemic changes.  Normal electrolytes.  Certainly this could have been associated with his vaping.  He is trying to cut back on his cigarettes he is down to a half a pack per day.  Continue with exercise.  Watch caffeine.  He and his wife  do drink tea.  He has been under increased stress in the fall with his wife having a meningioma removed.  She is doing well.  If symptoms return or become more worrisome, he can contact us and we will have a low threshold for placing a ZIO monitor.             Medication Adjustments/Labs and Tests Ordered: Current medicines are reviewed at length with the patient today.  Concerns regarding medicines are outlined above.  No orders of the defined types were placed in this encounter.  No orders of the defined types were placed in this encounter.   Patient Instructions  Medication Instructions:  The current medical regimen is effective;  continue present plan and medications.  *If you need a refill on your cardiac medications before your next appointment, please call your pharmacy*  Follow-Up: At [facility], you and your health needs are our priority.  As part of our continuing mission to provide you with exceptional heart care, we have created designated Provider Care Teams.  These Care Teams include your primary Cardiologist (physician) and Advanced Practice Providers (APPs -  Physician Assistants and Nurse Practitioners) who all work together to provide you with the care you need, when you need it.  We recommend signing up for the patient portal called "MyChart".  Sign up information is provided on this After Visit Summary.  MyChart is used to connect with patients for Virtual Visits (Telemedicine).  Patients are able to view lab/test results, encounter notes, upcoming appointments, etc.  Non-urgent messages can be sent to your provider as well.   To learn more about what you can do with MyChart, go to [email or web address].    Your next appointment:   Follow up as needed with Dr Marlou Porch     Signed, Candee Furbish, MD  07/17/2022 1:22 PM    [place]

## 2022-07-17 NOTE — Patient Instructions (Signed)
Medication Instructions:  The current medical regimen is effective;  continue present plan and medications.  *If you need a refill on your cardiac medications before your next appointment, please call your pharmacy*  Follow-Up: [facility], you and your health needs are our priority.  As part of our continuing mission to provide you with exceptional heart care, we have created designated Provider Care Teams.  These Care Teams include your primary Cardiologist (physician) and Advanced Practice Providers (APPs -  Physician Assistants and Nurse Practitioners) who all work together to provide you with the care you need, when you need it.  We recommend signing up for the patient portal called "MyChart".  Sign up information is provided on this After Visit Summary.  MyChart is used to connect with patients for Virtual Visits (Telemedicine).  Patients are able to view lab/test results, encounter notes, upcoming appointments, etc.  Non-urgent messages can be sent to your provider as well.   To learn more about what you can do with MyChart, go to [email or web address].    Your next appointment:   Follow up as needed with Dr Skains    

## 2023-11-17 ENCOUNTER — Encounter: Payer: Self-pay | Admitting: *Deleted
